# Patient Record
Sex: Male | Born: 1960 | Race: White | Hispanic: No | Marital: Married | Smoking: Never smoker
Health system: Southern US, Community
[De-identification: ages and names within clinical notes are randomized; demographics above are authoritative.]

## PROBLEM LIST (undated history)

## (undated) DIAGNOSIS — R0789 Other chest pain: Secondary | ICD-10-CM

## (undated) DIAGNOSIS — R0602 Shortness of breath: Secondary | ICD-10-CM

## (undated) HISTORY — DX: Other chest pain: R07.89

## (undated) HISTORY — DX: Shortness of breath: R06.02

## (undated) HISTORY — PX: NO PAST SURGERIES: SHX2092

---

## 1999-01-05 ENCOUNTER — Emergency Department (HOSPITAL_COMMUNITY): Admission: EM | Admit: 1999-01-05 | Discharge: 1999-01-05 | Payer: Self-pay | Admitting: Emergency Medicine

## 2002-10-16 ENCOUNTER — Emergency Department (HOSPITAL_COMMUNITY): Admission: EM | Admit: 2002-10-16 | Discharge: 2002-10-16 | Payer: Self-pay | Admitting: *Deleted

## 2002-10-16 ENCOUNTER — Encounter: Payer: Self-pay | Admitting: *Deleted

## 2016-02-07 ENCOUNTER — Ambulatory Visit: Payer: 59 | Admitting: Cardiovascular Disease

## 2016-02-11 ENCOUNTER — Encounter: Payer: Self-pay | Admitting: Cardiovascular Disease

## 2016-02-11 ENCOUNTER — Ambulatory Visit (INDEPENDENT_AMBULATORY_CARE_PROVIDER_SITE_OTHER): Payer: 59 | Admitting: Cardiovascular Disease

## 2016-02-11 VITALS — BP 130/94 | HR 64 | Ht 67.0 in | Wt 174.8 lb

## 2016-02-11 DIAGNOSIS — R079 Chest pain, unspecified: Secondary | ICD-10-CM

## 2016-02-11 DIAGNOSIS — R072 Precordial pain: Secondary | ICD-10-CM | POA: Diagnosis not present

## 2016-02-11 NOTE — Patient Instructions (Addendum)
Medication Instructions:  Your physician recommends that you continue on your current medications as directed. Please refer to the Current Medication list given to you today.  Increase your intake of fluids (water with electrolyte tabs like Nun tablets, or gatorade) , protein ( hard boiled eggs, chicken, fish) , and a electrolytes ( V-8 juice, salt, potassium chloride  which is sold as No-Salt   Labwork: None Ordered   Testing/Procedures: Your physician has requested that you have an exercise stress myoview. For further information please visit [email or web address] Please follow instruction sheet, as given.   Follow-Up: Your physician recommends that you schedule a follow-up appointment in: as needed with Dr. Elease HashimotoNahser.    If you need a refill on your cardiac medications before your next appointment, please call your pharmacy.   Thank you for choosing [facility]! Eligha BridegroomMichelle Swinyer, RN [phone]

## 2016-02-11 NOTE — Progress Notes (Signed)
Cardiology Office Note   Date:  02/11/2016   ID:  Brandon Schwartz, DOB September 15, 1961, MRN [id number]  PCP:  Farris Has, MD  Cardiologist:   Vesta Mixer, MD   Chief Complaint  Patient presents with  . Chest Pain   Problem List 1. Dyspnea.  2. Chest pressure    History of Present Illness: Brandon Schwartz is a 55 y.o. male who presents for chest pain / tightness I have reveiwed records from Brandon Schwartz .  Started March 27.   Tightness, Did lots of yard work several days  before   Tightness lasts a few minutes.   Gradually fades out on its own No associated dizziness or sweats.   Occasionally feels like he feels like he needs to take a deaper breath .  Almost always associated with doing lots of yard work several days prior.    Tries to stay hydrated while doing yard work .  Occasionally has palpitations that sound like PVCs  Goes to the gym regularly .   Frequently works out without any issues.   If he has some CP ,  These episodes will almost always occur on Mondays or Tuesdays .   Never / rarely on Thursday or Friday   Associated with Dizziness with muffled hearing   Works as a Sport and exercise psychologist.   Past Medical History  Diagnosis Date  . Chest discomfort   . SOB (shortness of breath)     Past Surgical History  Procedure Laterality Date  . No past surgeries       Current Outpatient Prescriptions  Medication Sig Dispense Refill  . aspirin 81 MG tablet Take 81 mg by mouth daily.    Marland Kitchen zolpidem (AMBIEN) 5 MG tablet Take 5 mg by mouth at bedtime as needed for sleep.     No current facility-administered medications for this visit.    Allergies:   Codeine    Social History:  The patient  reports that he has never smoked. He does not have any smokeless tobacco history on file. He reports that he drinks alcohol.   Family History:  The patient's family history includes Heart attack in his father; Evelene Croon Parkinson White syndrome in his brother.    ROS:  Please see  the history of present illness.    Review of Systems: Constitutional:  denies fever, chills, diaphoresis, appetite change and fatigue.  HEENT: denies photophobia, eye pain, redness, hearing loss, ear pain, congestion, sore throat, rhinorrhea, sneezing, neck pain, neck stiffness and tinnitus.  Respiratory: denies SOB, DOE, cough, chest tightness, and wheezing.  Cardiovascular: admits to chest pain, palpitations  Denies leg swelling.  Gastrointestinal: denies nausea, vomiting, abdominal pain, diarrhea, constipation, blood in stool.  Genitourinary: denies dysuria, urgency, frequency, hematuria, flank pain and difficulty urinating.  Musculoskeletal: denies  myalgias, back pain, joint swelling, arthralgias and gait problem.   Skin: denies pallor, rash and wound.  Neurological: denies dizziness, seizures, syncope, weakness, light-headedness, numbness and headaches.   Hematological: denies adenopathy, easy bruising, personal or family bleeding history.  Psychiatric/ Behavioral: denies suicidal ideation, mood changes, confusion, nervousness, sleep disturbance and agitation.       All other systems are reviewed and negative.    PHYSICAL EXAM: VS:  BP 130/94 mmHg  Pulse 64  Ht  (1.702 m)  Wt 174 lb 12.8 oz (79.289 kg)  BMI 27.37 kg/m2 , BMI Body mass index is 27.37 kg/(m^2). GEN: Well nourished, well developed, in no acute distress HEENT: normal Neck: no  JVD, carotid bruits, or masses Cardiac: RRR; no murmurs, rubs, or gallops,no edema  Respiratory:  clear to auscultation bilaterally, normal work of breathing GI: soft, nontender, nondistended, + BS MS: no deformity or atrophy Skin: warm and dry, no rash Neuro:  Strength and sensation are intact Psych: normal   EKG:  EKG is ordered today. The ekg ordered today demonstrates  NSR at 64.   Inc. RBBB   Recent Labs: No results found for requested labs within last 365 days.    Lipid Panel No results found for: CHOL, TRIG, HDL,  CHOLHDL, VLDL, LDLCALC, LDLDIRECT    Wt Readings from Last 3 Encounters:  02/11/16 174 lb 12.8 oz (79.289 kg)      Other studies Reviewed: Additional studies/ records that were reviewed today include: . Review of the above records demonstrates:    ASSESSMENT AND PLAN:  1.  Chest pain  - some typical and some atypical characteristics.   Father had an MI. Will get a Stress myoveiw.  Some aspects of the CP sound more like volume depletion.    He tries to drink while doing his yard work but admits that he does not always keep up . I've recommended V-8 juice - 1-2 cans a day on the days that he spends working outside.   I suspect that he will feel better if he hydrates better.   I will see him as needed ( assuming that the myoview is normal )    Current medicines are reviewed at length with the patient today.  The patient does not have concerns regarding medicines.  The following changes have been made:  no change  Labs/ tests ordered today include:   Orders Placed This Encounter  Procedures  . Myocardial Perfusion Imaging  . EKG 12-Lead     Disposition:   FU with me as needed.      Nahser, Deloris PingPhilip J, MD  02/11/2016 12:17 PM    [facility] [address], [place], [place]  [address] Phone: [phone]; Fax: [phone]   [facility]  [address] [place], [place]  [address] [phone]   Fax [phone]

## 2016-02-14 ENCOUNTER — Telehealth (HOSPITAL_COMMUNITY): Payer: Self-pay | Admitting: *Deleted

## 2016-02-14 NOTE — Telephone Encounter (Signed)
Left message on voicemail in reference to upcoming appointment scheduled for 02/19/16  Phone number given for a call back so details instructions can be given. Erich Kochan J Amillion Macchia, RN 

## 2016-02-19 ENCOUNTER — Ambulatory Visit (HOSPITAL_COMMUNITY): Payer: 59 | Attending: Cardiovascular Disease

## 2016-02-19 DIAGNOSIS — Z8249 Family history of ischemic heart disease and other diseases of the circulatory system: Secondary | ICD-10-CM | POA: Diagnosis not present

## 2016-02-19 DIAGNOSIS — R079 Chest pain, unspecified: Secondary | ICD-10-CM | POA: Diagnosis not present

## 2016-02-19 DIAGNOSIS — R0609 Other forms of dyspnea: Secondary | ICD-10-CM | POA: Insufficient documentation

## 2016-02-19 LAB — MYOCARDIAL PERFUSION IMAGING
Estimated workload: 10.1 METS
Exercise duration (min): 9 min
Exercise duration (sec): 0 s
LV dias vol: 120 mL (ref 62–150)
LV sys vol: 58 mL
MPHR: 167 {beats}/min
Peak HR: 151 {beats}/min
Percent HR: 90 %
RATE: 0.28
Rest HR: 67 {beats}/min
SDS: 1
SRS: 5
SSS: 6
TID: 0.98

## 2016-02-19 MED ORDER — TECHNETIUM TC 99M SESTAMIBI GENERIC - CARDIOLITE
10.6000 | Freq: Once | INTRAVENOUS | Status: AC | PRN
  Administered 2016-02-19: 11 via INTRAVENOUS

## 2016-02-19 MED ORDER — TECHNETIUM TC 99M SESTAMIBI GENERIC - CARDIOLITE
32.8000 | Freq: Once | INTRAVENOUS | Status: AC | PRN
  Administered 2016-02-19: 32.8 via INTRAVENOUS

## 2017-02-13 DIAGNOSIS — G4700 Insomnia, unspecified: Secondary | ICD-10-CM | POA: Diagnosis not present

## 2017-02-13 DIAGNOSIS — H9312 Tinnitus, left ear: Secondary | ICD-10-CM | POA: Diagnosis not present

## 2017-04-01 DIAGNOSIS — H903 Sensorineural hearing loss, bilateral: Secondary | ICD-10-CM | POA: Diagnosis not present

## 2017-04-01 DIAGNOSIS — H9312 Tinnitus, left ear: Secondary | ICD-10-CM | POA: Diagnosis not present

## 2017-04-01 DIAGNOSIS — J3089 Other allergic rhinitis: Secondary | ICD-10-CM | POA: Diagnosis not present

## 2017-04-08 DIAGNOSIS — Z125 Encounter for screening for malignant neoplasm of prostate: Secondary | ICD-10-CM | POA: Diagnosis not present

## 2017-04-08 DIAGNOSIS — Z0000 Encounter for general adult medical examination without abnormal findings: Secondary | ICD-10-CM | POA: Diagnosis not present

## 2017-05-11 DIAGNOSIS — R5383 Other fatigue: Secondary | ICD-10-CM | POA: Diagnosis not present

## 2017-05-11 DIAGNOSIS — R972 Elevated prostate specific antigen [PSA]: Secondary | ICD-10-CM | POA: Diagnosis not present

## 2017-06-23 IMAGING — NM NM MISC PROCEDURE
6 series · 36 of 36 positions shown · non-contrast
Comparison: none

[Series 1: stress-sum-em · 6.40mm/px · 6 of 64 frames shown]
[frame 6/64]
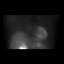
[frame 16/64]
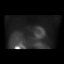
[frame 27/64]
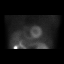
[frame 38/64]
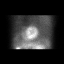
[frame 48/64]
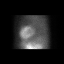
[frame 59/64]
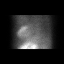

[Series 1: rest · 6.40mm/px · 6 of 64 frames shown]
[frame 6/64]
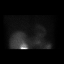
[frame 16/64]
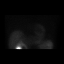
[frame 27/64]
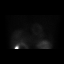
[frame 38/64]
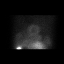
[frame 48/64]
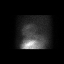
[frame 59/64]
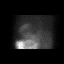

[Series 1: wbr_s-proj_st stress-gsp · 6.40mm/px · 6 of 512 frames shown]
[frame 43/512]
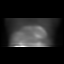
[frame 128/512]
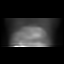
[frame 214/512]
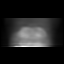
[frame 299/512]
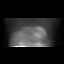
[frame 384/512]
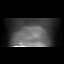
[frame 470/512]
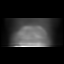

[Series 1: wbr_r-proj_st rest · 6.40mm/px · 6 of 64 frames shown]
[frame 6/64]
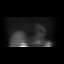
[frame 16/64]
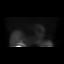
[frame 27/64]
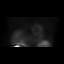
[frame 38/64]
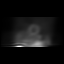
[frame 48/64]
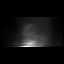
[frame 59/64]
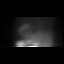

[Series 1: stress-gsp · 6.40mm/px · 6 of 512 frames shown]
[frame 43/512]
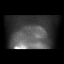
[frame 128/512]
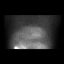
[frame 214/512]
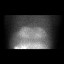
[frame 299/512]
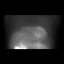
[frame 384/512]
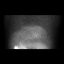
[frame 470/512]
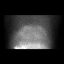

[Series 1: wbr_s-proj_st stress-sum-em · 6.40mm/px · 6 of 64 frames shown]
[frame 6/64]
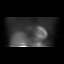
[frame 16/64]
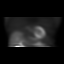
[frame 27/64]
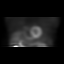
[frame 38/64]
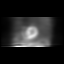
[frame 48/64]
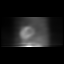
[frame 59/64]
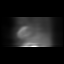

[36 of 36 positions shown; findings below may reference images not displayed]

Canned report from images found in remote index.

Refer to host system for actual result text.

## 2018-05-20 DIAGNOSIS — Z0000 Encounter for general adult medical examination without abnormal findings: Secondary | ICD-10-CM | POA: Diagnosis not present

## 2018-05-20 DIAGNOSIS — Z125 Encounter for screening for malignant neoplasm of prostate: Secondary | ICD-10-CM | POA: Diagnosis not present

## 2018-05-20 DIAGNOSIS — R7309 Other abnormal glucose: Secondary | ICD-10-CM | POA: Diagnosis not present

## 2018-05-20 DIAGNOSIS — Z23 Encounter for immunization: Secondary | ICD-10-CM | POA: Diagnosis not present

## 2018-06-17 DIAGNOSIS — R972 Elevated prostate specific antigen [PSA]: Secondary | ICD-10-CM | POA: Diagnosis not present

## 2018-07-22 DIAGNOSIS — Z23 Encounter for immunization: Secondary | ICD-10-CM | POA: Diagnosis not present

## 2018-09-08 DIAGNOSIS — R972 Elevated prostate specific antigen [PSA]: Secondary | ICD-10-CM | POA: Diagnosis not present

## 2019-09-26 ENCOUNTER — Other Ambulatory Visit: Payer: Self-pay

## 2019-09-26 DIAGNOSIS — Z20822 Contact with and (suspected) exposure to covid-19: Secondary | ICD-10-CM

## 2019-09-27 LAB — NOVEL CORONAVIRUS, NAA: SARS-CoV-2, NAA: NOT DETECTED

## 2021-07-18 ENCOUNTER — Other Ambulatory Visit (HOSPITAL_BASED_OUTPATIENT_CLINIC_OR_DEPARTMENT_OTHER): Payer: Self-pay | Admitting: Family Medicine

## 2021-07-18 DIAGNOSIS — N5089 Other specified disorders of the male genital organs: Secondary | ICD-10-CM

## 2021-07-22 ENCOUNTER — Other Ambulatory Visit: Payer: Self-pay

## 2021-07-22 ENCOUNTER — Ambulatory Visit (HOSPITAL_BASED_OUTPATIENT_CLINIC_OR_DEPARTMENT_OTHER)
Admission: RE | Admit: 2021-07-22 | Discharge: 2021-07-22 | Disposition: A | Discharge status: Home / Self Care | Payer: 59 | Source: Ambulatory Visit | Attending: Family Medicine | Admitting: Family Medicine

## 2021-07-22 DIAGNOSIS — N5089 Other specified disorders of the male genital organs: Secondary | ICD-10-CM

## 2022-10-08 ENCOUNTER — Other Ambulatory Visit: Payer: Self-pay | Admitting: Urology

## 2022-10-08 DIAGNOSIS — R972 Elevated prostate specific antigen [PSA]: Secondary | ICD-10-CM

## 2022-11-15 ENCOUNTER — Ambulatory Visit
Admission: RE | Admit: 2022-11-15 | Discharge: 2022-11-15 | Disposition: A | Discharge status: Home / Self Care | Payer: 59 | Source: Ambulatory Visit | Attending: Urology | Admitting: Urology

## 2022-11-15 DIAGNOSIS — R972 Elevated prostate specific antigen [PSA]: Secondary | ICD-10-CM

## 2022-11-15 MED ORDER — GADOPICLENOL 0.5 MMOL/ML IV SOLN
7.5000 mL | Freq: Once | INTRAVENOUS | Status: AC | PRN
  Administered 2022-11-15: 7.5 mL via INTRAVENOUS

## 2022-11-24 IMAGING — US US SCROTUM W/ DOPPLER COMPLETE
1 series · 13 of 25 positions shown · non-contrast
Comparison: None.

CLINICAL DATA: Right-sided testicular swelling.

EXAM:
SCROTAL ULTRASOUND
DOPPLER ULTRASOUND OF THE TESTICLES
TECHNIQUE: Complete ultrasound examination of the testicles, epididymis, and
other scrotal structures was performed. Color and spectral Doppler
ultrasound were also utilized to evaluate blood flow to the
testicles.

[Series 1: us scrotum w/doppler · 59 acquisitions, 13 frames shown]
[im 1/59]
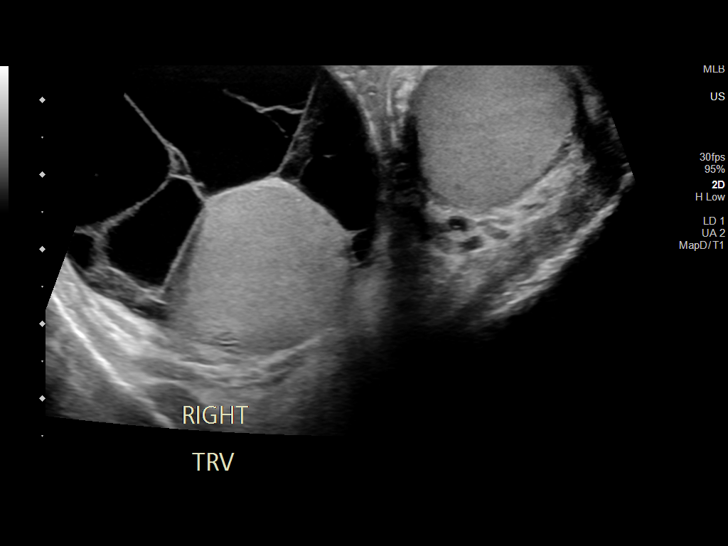
[im 5/59]
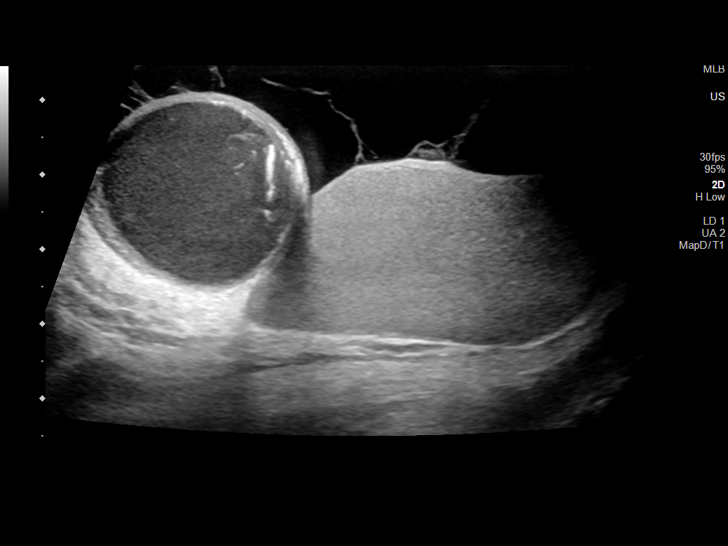
[im 10/59]
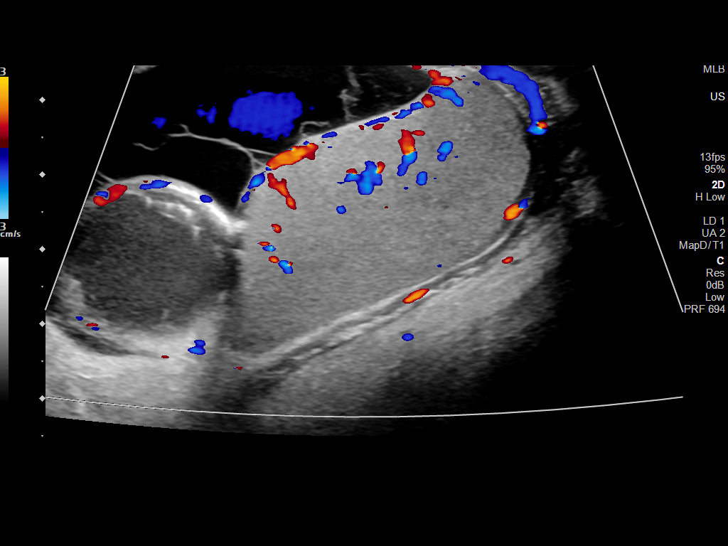
[im 15/59]
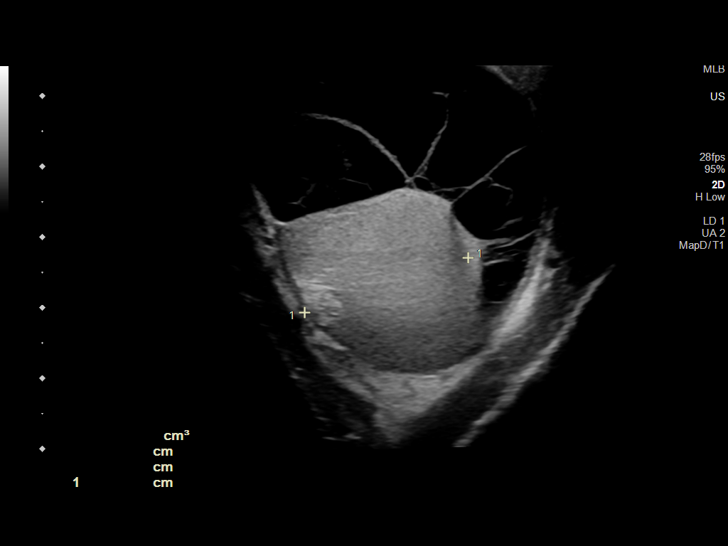
[im 20/59]
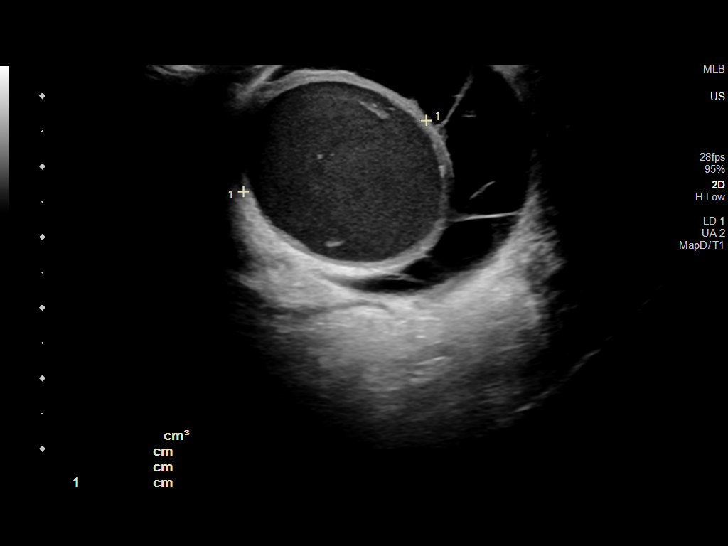
[im 25/59]
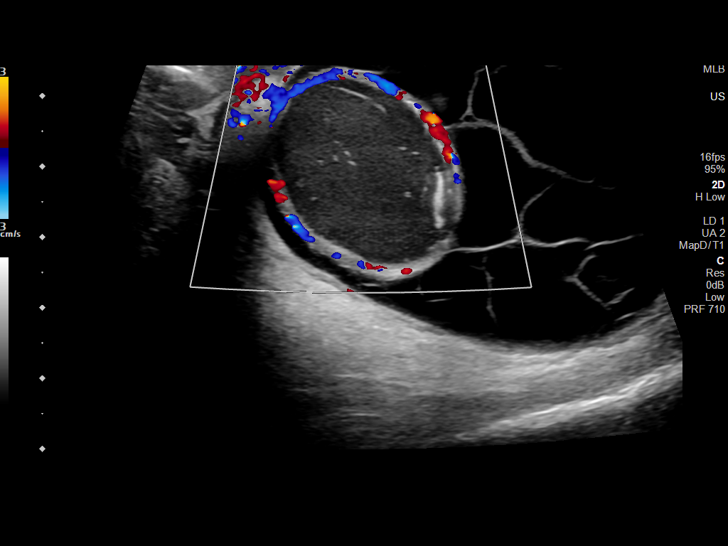
[im 30/59]
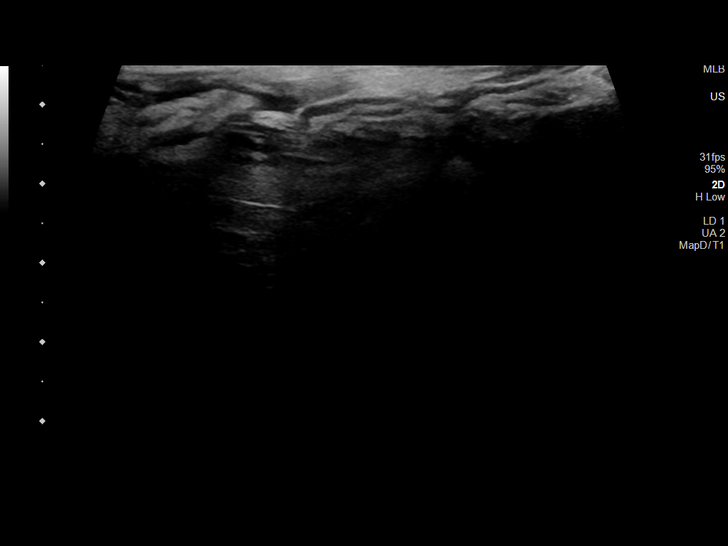
[im 34/59]
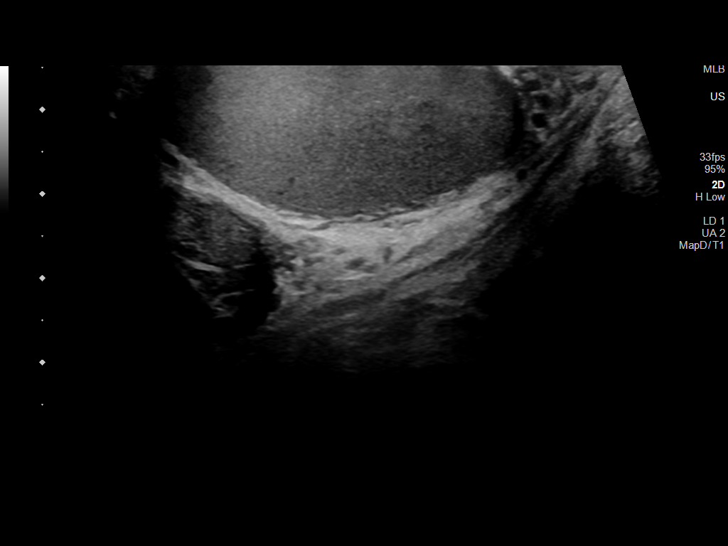
[im 39/59]
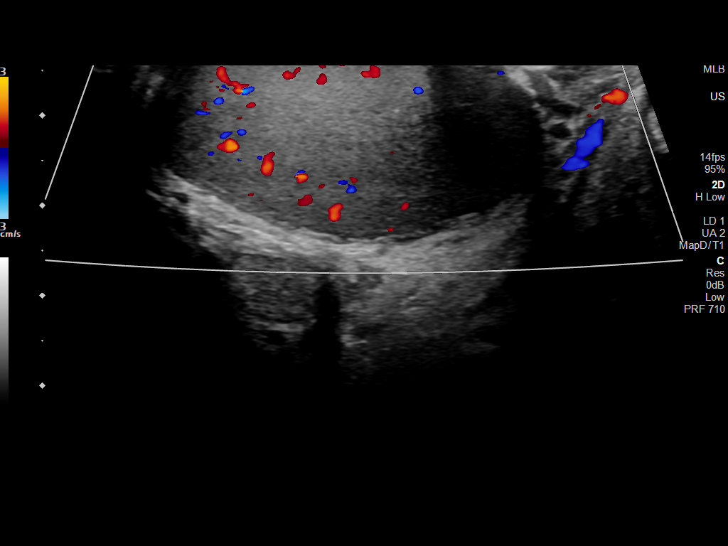
[im 44/59]
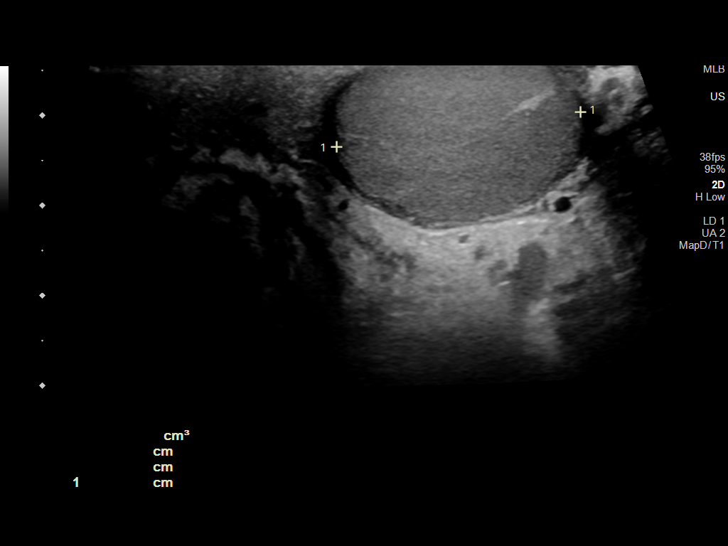
[im 49/59]
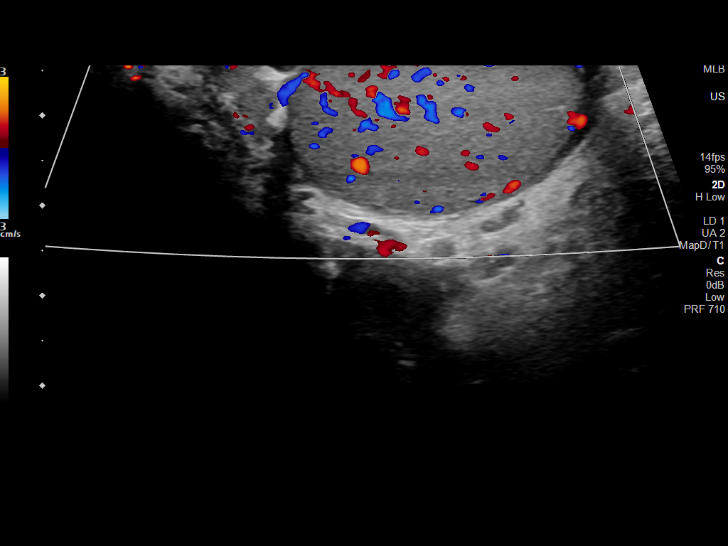
[im 54/59]
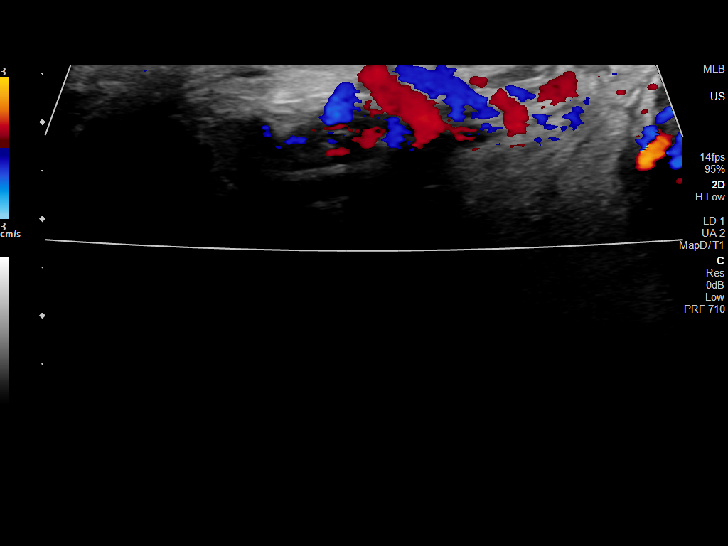
[im 59/59]
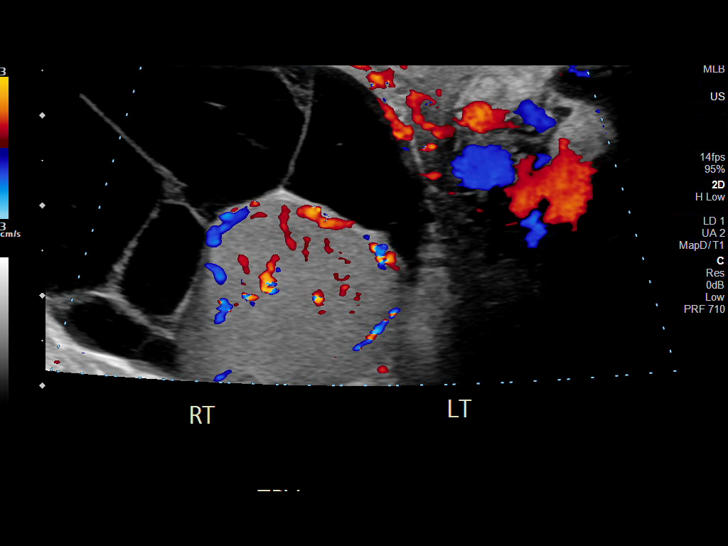

[13 of 25 positions shown; findings below may reference images not displayed]

FINDINGS: Right testicle

Measurements: 4.6 x 2.3 x 2.4 cm. No mass or microlithiasis
visualized.

Left testicle

Measurements: 4.2 x 2.1 x 2.7 cm. No mass or microlithiasis
visualized.

Right epididymis: The right epididymis is enlarged. There is a 3.0 x
2.5 x 2.8 cm complex cyst containing uniform low-level echogenic
content as well as areas of calcification.

Left epididymis: Normal in size and appearance. Subcentimeter left
epididymal head cyst.

Hydrocele: There is a moderate sized complex right hydrocele
containing multiple lace-like septations, likely sequela of prior
infection/pyocele.

Varicocele:  None visualized.

Pulsed Doppler interrogation of both testes demonstrates normal low
resistance arterial and venous waveforms bilaterally.
IMPRESSION: 1. Unremarkable testicles.
2. Moderate complex and multi septated right hydrocele as well as a
complex right epididymal cyst. Findings are likely sequela of prior
inflammatory/infectious process.
